# Patient Record
Sex: Female | Born: 1980 | Race: White | Hispanic: No | Marital: Married | Smoking: Never smoker
Health system: Southern US, Community
[De-identification: ages and names within clinical notes are randomized; demographics above are authoritative.]

## PROBLEM LIST (undated history)

## (undated) DIAGNOSIS — Z801 Family history of malignant neoplasm of trachea, bronchus and lung: Secondary | ICD-10-CM

## (undated) DIAGNOSIS — Z808 Family history of malignant neoplasm of other organs or systems: Secondary | ICD-10-CM

## (undated) DIAGNOSIS — Z800 Family history of malignant neoplasm of digestive organs: Secondary | ICD-10-CM

## (undated) DIAGNOSIS — Z803 Family history of malignant neoplasm of breast: Secondary | ICD-10-CM

## (undated) HISTORY — DX: Family history of malignant neoplasm of digestive organs: Z80.0

## (undated) HISTORY — DX: Family history of malignant neoplasm of other organs or systems: Z80.8

## (undated) HISTORY — DX: Family history of malignant neoplasm of trachea, bronchus and lung: Z80.1

## (undated) HISTORY — DX: Family history of malignant neoplasm of breast: Z80.3

## (undated) HISTORY — PX: TONSILLECTOMY: SUR1361

---
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
## 2000-06-22 ENCOUNTER — Encounter (INDEPENDENT_AMBULATORY_CARE_PROVIDER_SITE_OTHER): Payer: Self-pay | Admitting: Specialist

## 2000-06-22 ENCOUNTER — Other Ambulatory Visit: Admission: RE | Admit: 2000-06-22 | Discharge: 2000-06-22 | Payer: Self-pay | Admitting: Gynecology

## 2000-09-01 HISTORY — PX: BREAST ENHANCEMENT SURGERY: SHX7

## 2010-01-02 ENCOUNTER — Inpatient Hospital Stay (HOSPITAL_COMMUNITY): Admission: AD | Admit: 2010-01-02 | Discharge: 2010-01-05 | Payer: Self-pay | Admitting: Obstetrics and Gynecology

## 2010-01-03 ENCOUNTER — Encounter (INDEPENDENT_AMBULATORY_CARE_PROVIDER_SITE_OTHER): Payer: Self-pay | Admitting: Obstetrics and Gynecology

## 2010-11-19 LAB — CBC
HCT: 26.7 % — ABNORMAL LOW (ref 36.0–46.0)
Hemoglobin: 13.1 g/dL (ref 12.0–15.0)
Hemoglobin: 9.3 g/dL — ABNORMAL LOW (ref 12.0–15.0)
MCHC: 34.6 g/dL (ref 30.0–36.0)
MCV: 93.4 fL (ref 78.0–100.0)
Platelets: 162 10*3/uL (ref 150–400)
RBC: 2.86 MIL/uL — ABNORMAL LOW (ref 3.87–5.11)
RBC: 4.06 MIL/uL (ref 3.87–5.11)
WBC: 10.2 10*3/uL (ref 4.0–10.5)

## 2011-09-02 NOTE — L&D Delivery Note (Signed)
Delivery Note  SVD viable female Apgars 9,9 over intact perineum with 1st degree anterior lac.  Nuchal x 1 reduced. Placenta delivered spontaneously intact with 3VC. Repair with 3-0 Chromic with good support and hemostasis noted and R/V exam confirms.  PH art was sbet.  [place] cord blood was not done.  Mother and baby were doing well.  EBL 300  Candice Camp, MD

## 2011-12-12 ENCOUNTER — Inpatient Hospital Stay (HOSPITAL_COMMUNITY): Payer: Managed Care, Other (non HMO) | Admitting: Anesthesiology

## 2011-12-12 ENCOUNTER — Inpatient Hospital Stay (HOSPITAL_COMMUNITY)
Admission: AD | Admit: 2011-12-12 | Discharge: 2011-12-14 | DRG: 775 | Disposition: A | Discharge status: Home / Self Care | Payer: Managed Care, Other (non HMO) | Source: Ambulatory Visit | Attending: Obstetrics and Gynecology | Admitting: Obstetrics and Gynecology

## 2011-12-12 ENCOUNTER — Encounter (HOSPITAL_COMMUNITY): Payer: Self-pay | Admitting: *Deleted

## 2011-12-12 ENCOUNTER — Encounter (HOSPITAL_COMMUNITY): Payer: Self-pay | Admitting: Anesthesiology

## 2011-12-12 LAB — CBC
MCHC: 33.7 g/dL (ref 30.0–36.0)
RDW: 12.8 % (ref 11.5–15.5)
WBC: 9.8 10*3/uL (ref 4.0–10.5)

## 2011-12-12 LAB — GC/CHLAMYDIA PROBE AMP, GENITAL: Chlamydia: NEGATIVE

## 2011-12-12 LAB — STREP B DNA PROBE: GBS: NEGATIVE

## 2011-12-12 LAB — ABO/RH: RH Type: POSITIVE

## 2011-12-12 LAB — RUBELLA ANTIBODY, IGM: Rubella: IMMUNE

## 2011-12-12 MED ORDER — DIPHENHYDRAMINE HCL 50 MG/ML IJ SOLN: 12.5000 mg | INTRAMUSCULAR | Status: DC | PRN

## 2011-12-12 MED ORDER — SENNOSIDES-DOCUSATE SODIUM 8.6-50 MG PO TABS
2.0000 | ORAL_TABLET | Freq: Every day | ORAL | Status: DC
  Administered 2011-12-13: 2 via ORAL

## 2011-12-12 MED ORDER — MEASLES, MUMPS & RUBELLA VAC ~~LOC~~ INJ
0.5000 mL | INJECTION | Freq: Once | SUBCUTANEOUS | Status: DC
  Filled 2011-12-12: qty 0.5

## 2011-12-12 MED ORDER — FENTANYL 2.5 MCG/ML BUPIVACAINE 1/10 % EPIDURAL INFUSION (WH - ANES)
INTRAMUSCULAR | Status: DC | PRN
  Administered 2011-12-12: 14 mL/h via EPIDURAL

## 2011-12-12 MED ORDER — OXYCODONE-ACETAMINOPHEN 5-325 MG PO TABS: 1.0000 | ORAL_TABLET | ORAL | Status: DC | PRN

## 2011-12-12 MED ORDER — BENZOCAINE-MENTHOL 20-0.5 % EX AERO
1.0000 "application " | INHALATION_SPRAY | CUTANEOUS | Status: DC | PRN
  Administered 2011-12-14: 1 via TOPICAL

## 2011-12-12 MED ORDER — SIMETHICONE 80 MG PO CHEW: 80.0000 mg | CHEWABLE_TABLET | ORAL | Status: DC | PRN

## 2011-12-12 MED ORDER — PHENYLEPHRINE 40 MCG/ML (10ML) SYRINGE FOR IV PUSH (FOR BLOOD PRESSURE SUPPORT): 80.0000 ug | PREFILLED_SYRINGE | INTRAVENOUS | Status: DC | PRN

## 2011-12-12 MED ORDER — SODIUM CHLORIDE 0.9 % IJ SOLN: 3.0000 mL | INTRAMUSCULAR | Status: DC | PRN

## 2011-12-12 MED ORDER — LACTATED RINGERS IV SOLN: 500.0000 mL | INTRAVENOUS | Status: DC | PRN

## 2011-12-12 MED ORDER — EPHEDRINE 5 MG/ML INJ
10.0000 mg | INTRAVENOUS | Status: DC | PRN
  Filled 2011-12-12: qty 4

## 2011-12-12 MED ORDER — LIDOCAINE HCL (PF) 1 % IJ SOLN
INTRAMUSCULAR | Status: DC | PRN
  Administered 2011-12-12 (×3): 4 mL

## 2011-12-12 MED ORDER — CITRIC ACID-SODIUM CITRATE 334-500 MG/5ML PO SOLN: 30.0000 mL | ORAL | Status: DC | PRN

## 2011-12-12 MED ORDER — WITCH HAZEL-GLYCERIN EX PADS
1.0000 "application " | MEDICATED_PAD | CUTANEOUS | Status: DC | PRN
  Administered 2011-12-14: 1 via TOPICAL

## 2011-12-12 MED ORDER — OXYTOCIN 20 UNITS IN LACTATED RINGERS INFUSION - SIMPLE: 125.0000 mL/h | Freq: Once | INTRAVENOUS | Status: DC

## 2011-12-12 MED ORDER — LACTATED RINGERS IV SOLN: 500.0000 mL | Freq: Once | INTRAVENOUS | Status: DC

## 2011-12-12 MED ORDER — ONDANSETRON HCL 4 MG/2ML IJ SOLN: 4.0000 mg | INTRAMUSCULAR | Status: DC | PRN

## 2011-12-12 MED ORDER — DIPHENHYDRAMINE HCL 25 MG PO CAPS: 25.0000 mg | ORAL_CAPSULE | Freq: Four times a day (QID) | ORAL | Status: DC | PRN

## 2011-12-12 MED ORDER — IBUPROFEN 600 MG PO TABS
600.0000 mg | ORAL_TABLET | Freq: Four times a day (QID) | ORAL | Status: DC
  Administered 2011-12-13 – 2011-12-14 (×6): 600 mg via ORAL
  Filled 2011-12-12 (×6): qty 1

## 2011-12-12 MED ORDER — PRENATAL MULTIVITAMIN CH: 1.0000 | ORAL_TABLET | Freq: Every day | ORAL | Status: DC

## 2011-12-12 MED ORDER — TETANUS-DIPHTH-ACELL PERTUSSIS 5-2.5-18.5 LF-MCG/0.5 IM SUSP: 0.5000 mL | Freq: Once | INTRAMUSCULAR | Status: DC

## 2011-12-12 MED ORDER — ACETAMINOPHEN 325 MG PO TABS: 650.0000 mg | ORAL_TABLET | ORAL | Status: DC | PRN

## 2011-12-12 MED ORDER — PRENATAL MULTIVITAMIN CH
1.0000 | ORAL_TABLET | Freq: Every day | ORAL | Status: DC
  Administered 2011-12-13 – 2011-12-14 (×2): 1 via ORAL
  Filled 2011-12-12 (×2): qty 1

## 2011-12-12 MED ORDER — ONDANSETRON HCL 4 MG/2ML IJ SOLN: 4.0000 mg | Freq: Four times a day (QID) | INTRAMUSCULAR | Status: DC | PRN

## 2011-12-12 MED ORDER — FENTANYL 2.5 MCG/ML BUPIVACAINE 1/10 % EPIDURAL INFUSION (WH - ANES)
14.0000 mL/h | INTRAMUSCULAR | Status: DC
  Filled 2011-12-12: qty 60

## 2011-12-12 MED ORDER — MEDROXYPROGESTERONE ACETATE 150 MG/ML IM SUSP: 150.0000 mg | INTRAMUSCULAR | Status: DC | PRN

## 2011-12-12 MED ORDER — ZOLPIDEM TARTRATE 5 MG PO TABS: 5.0000 mg | ORAL_TABLET | Freq: Every evening | ORAL | Status: DC | PRN

## 2011-12-12 MED ORDER — PHENYLEPHRINE 40 MCG/ML (10ML) SYRINGE FOR IV PUSH (FOR BLOOD PRESSURE SUPPORT)
80.0000 ug | PREFILLED_SYRINGE | INTRAVENOUS | Status: DC | PRN
  Filled 2011-12-12: qty 5

## 2011-12-12 MED ORDER — DIBUCAINE 1 % RE OINT: 1.0000 "application " | TOPICAL_OINTMENT | RECTAL | Status: DC | PRN

## 2011-12-12 MED ORDER — ONDANSETRON HCL 4 MG PO TABS: 4.0000 mg | ORAL_TABLET | ORAL | Status: DC | PRN

## 2011-12-12 MED ORDER — LANOLIN HYDROUS EX OINT: TOPICAL_OINTMENT | CUTANEOUS | Status: DC | PRN

## 2011-12-12 MED ORDER — TERBUTALINE SULFATE 1 MG/ML IJ SOLN: 0.2500 mg | Freq: Once | INTRAMUSCULAR | Status: DC | PRN

## 2011-12-12 MED ORDER — LACTATED RINGERS IV SOLN: INTRAVENOUS | Status: DC

## 2011-12-12 MED ORDER — OXYTOCIN 20 UNITS IN LACTATED RINGERS INFUSION - SIMPLE
1.0000 m[IU]/min | INTRAVENOUS | Status: DC
  Administered 2011-12-12: 2 m[IU]/min via INTRAVENOUS
  Filled 2011-12-12: qty 1000

## 2011-12-12 MED ORDER — LIDOCAINE HCL (PF) 1 % IJ SOLN
30.0000 mL | INTRAMUSCULAR | Status: DC | PRN
  Filled 2011-12-12: qty 30

## 2011-12-12 MED ORDER — OXYCODONE-ACETAMINOPHEN 5-325 MG PO TABS
1.0000 | ORAL_TABLET | ORAL | Status: DC | PRN
  Administered 2011-12-13 (×2): 1 via ORAL
  Filled 2011-12-12 (×2): qty 1

## 2011-12-12 MED ORDER — FLEET ENEMA 7-19 GM/118ML RE ENEM: 1.0000 | ENEMA | RECTAL | Status: DC | PRN

## 2011-12-12 MED ORDER — IBUPROFEN 600 MG PO TABS: 600.0000 mg | ORAL_TABLET | Freq: Four times a day (QID) | ORAL | Status: DC | PRN

## 2011-12-12 MED ORDER — OXYTOCIN BOLUS FROM INFUSION
500.0000 mL | Freq: Once | INTRAVENOUS | Status: DC
  Filled 2011-12-12: qty 500

## 2011-12-12 MED ORDER — EPHEDRINE 5 MG/ML INJ: 10.0000 mg | INTRAVENOUS | Status: DC | PRN

## 2011-12-12 NOTE — Anesthesia Preprocedure Evaluation (Signed)
Anesthesia Evaluation  Patient identified by MRN, date of birth, ID band Patient awake    Reviewed: Allergy & Precautions, H&P , NPO status , Patient's Chart, lab work & pertinent test results, reviewed documented beta blocker date and time   History of Anesthesia Complications Negative for: history of anesthetic complications  Airway Mallampati: I TM Distance: >3 FB Neck ROM: full    Dental  (+) Teeth Intact   Pulmonary neg pulmonary ROS,  breath sounds clear to auscultation        Cardiovascular negative cardio ROS  Rhythm:regular Rate:Normal     Neuro/Psych negative neurological ROS  negative psych ROS   GI/Hepatic negative GI ROS, Neg liver ROS,   Endo/Other  negative endocrine ROS  Renal/GU negative Renal ROS  negative genitourinary   Musculoskeletal   Abdominal   Peds  Hematology negative hematology ROS (+)   Anesthesia Other Findings   Reproductive/Obstetrics (+) Pregnancy                           Anesthesia Physical Anesthesia Plan  ASA: II  Anesthesia Plan: Epidural   Post-op Pain Management:    Induction:   Airway Management Planned:   Additional Equipment:   Intra-op Plan:   Post-operative Plan:   Informed Consent: I have reviewed the patients History and Physical, chart, labs and discussed the procedure including the risks, benefits and alternatives for the proposed anesthesia with the patient or authorized representative who has indicated his/her understanding and acceptance.     Plan Discussed with:   Anesthesia Plan Comments:         Anesthesia Quick Evaluation  

## 2011-12-12 NOTE — H&P (Signed)
Maureen Compton is a 31 y.o. female presenting for ROM at 100 but had question of leaking since yesterday and was seen in our office and had normal eval.  Continued to have intermittant leaking since and had a gush of clear fluid this afternoon.  Pregnancy otherwise uncomplicated.  GBS -. History OB History    Grav Para Term Preterm Abortions TAB SAB Ect Mult Living   2 1 1  0 0 0 0 0 0 1     History reviewed. No pertinent past medical history. Past Surgical History  Procedure Date  . Breast enhancement surgery 2002  . Tonsillectomy    Family History: family history is not on file. Social History:  reports that she has never smoked. She has never used smokeless tobacco. She reports that she does not drink alcohol or use illicit drugs.  ROS  Cx 4/50/-2 gross rupturr Blood pressure 109/60, pulse 64, temperature 98.2 F (36.8 C), temperature source Oral, resp. rate 18, height 5\' 7"  (1.702 m), weight 91.173 kg (201 lb), SpO2 99.00%. Exam Physical Exam  Prenatal labs: ABO, Rh: A/Positive/-- (04/12 1503) Antibody: Negative (04/12 1503) Rubella: Immune (04/12 1503) RPR: Nonreactive (04/12 1503)  HBsAg: Negative (04/12 1503)  HIV: Non-reactive (04/12 1503)  GBS: Negative (04/12 1503)   Assessment/Plan: IUP at term with SROM  Plan pitocin augmentation Anticipate SVD   Tyrell Seifer C 12/12/2011, 8:49 PM

## 2011-12-12 NOTE — Anesthesia Procedure Notes (Signed)
Epidural Patient location during procedure: OB Start time: 12/12/2011 6:12 PM Reason for block: procedure for pain  Staffing Performed by: anesthesiologist   Preanesthetic Checklist Completed: patient identified, site marked, surgical consent, pre-op evaluation, timeout performed, IV checked, risks and benefits discussed and monitors and equipment checked  Epidural Patient position: sitting Prep: site prepped and draped and DuraPrep Patient monitoring: continuous pulse ox and blood pressure Approach: midline Injection technique: LOR air  Needle:  Needle type: Tuohy  Needle gauge: 17 G Needle length: 9 cm Needle insertion depth: 4 cm Catheter type: closed end flexible Catheter size: 19 Gauge Catheter at skin depth: 9 cm Test dose: negative  Assessment Events: blood not aspirated, injection not painful, no injection resistance, negative IV test and no paresthesia  Additional Notes Discussed risk of headache, infection, bleeding, nerve injury and failed or incomplete block.  Patient voices understanding and wishes to proceed.

## 2011-12-12 NOTE — MAU Note (Signed)
C/o leaking of fluid that started 12/11/11 @ 0900; seen in office @ 1100 and told not ruptured; leaking has continued since then until 1345 today when she felt and saw more thin clear fluid Maureen Compton [facility] 12/02/11

## 2011-12-13 LAB — CBC
MCH: 29.5 pg (ref 26.0–34.0)
MCHC: 32.6 g/dL (ref 30.0–36.0)
MCV: 90.7 fL (ref 78.0–100.0)
Platelets: 205 10*3/uL (ref 150–400)
RBC: 3.96 MIL/uL (ref 3.87–5.11)
RDW: 12.9 % (ref 11.5–15.5)

## 2011-12-13 LAB — RPR: RPR Ser Ql: NONREACTIVE

## 2011-12-13 NOTE — Progress Notes (Signed)
Post Partum Day 1 Subjective: no complaints and tolerating PO  Objective: Blood pressure 114/71, pulse 74, temperature 97.8 F (36.6 C), temperature source Axillary, resp. rate 16, height 5\' 7"  (1.702 m), weight 91.173 kg (201 lb), SpO2 99.00%, unknown if currently breastfeeding.  Physical Exam:  General: alert, cooperative and no distress Lochia: appropriate Uterine Fundus: firm Incision: healing well DVT Evaluation: No evidence of DVT seen on physical exam.   Basename 12/13/11 0546 12/12/11 1702  HGB 11.7* 13.2  HCT 35.9* 39.2    Assessment/Plan: Plan for discharge tomorrow and Breastfeeding   LOS: 1 day   Wyoma Genson C 12/13/2011, 9:50 AM

## 2011-12-13 NOTE — Anesthesia Postprocedure Evaluation (Signed)
  Anesthesia Post-op Note  Patient: Maureen Compton  Procedure(s) Performed: * No procedures listed *  Patient Location: PACU and Mother/Baby  Anesthesia Type: Epidural  Level of Consciousness: awake, alert  and oriented  Airway and Oxygen Therapy: Patient Spontanous Breathing  Post-op Assessment: Patient's Cardiovascular Status Stable and Respiratory Function Stable  Post-op Vital Signs: stable  Complications: No apparent anesthesia complications

## 2011-12-14 MED ORDER — OXYCODONE-ACETAMINOPHEN 5-325 MG PO TABS: 1.0000 | ORAL_TABLET | ORAL | Status: AC | PRN

## 2011-12-14 MED ORDER — BENZOCAINE-MENTHOL 20-0.5 % EX AERO
INHALATION_SPRAY | CUTANEOUS | Status: AC
  Administered 2011-12-14: 1 via TOPICAL
  Filled 2011-12-14: qty 56

## 2011-12-14 NOTE — Progress Notes (Signed)
Post Partum Day 2 Subjective: no complaints and up ad lib  Objective: Blood pressure 97/65, pulse 53, temperature 97.6 F (36.4 C), temperature source Oral, resp. rate 18, height 5\' 7"  (1.702 m), weight 91.173 kg (201 lb), SpO2 99.00%, unknown if currently breastfeeding.  Physical Exam:  General: alert, cooperative and no distress Lochia: appropriate Uterine Fundus: firm Incision:  DVT Evaluation: No evidence of DVT seen on physical exam.   Basename 12/13/11 0546 12/12/11 1702  HGB 11.7* 13.2  HCT 35.9* 39.2    Assessment/Plan: Discharge home and Breastfeeding   LOS: 2 days   Steel Kerney C 12/14/2011, 9:44 AM

## 2011-12-29 NOTE — Discharge Summary (Signed)
Obstetric Discharge Summary Reason for Admission: rupture of membranes Prenatal Procedures: ultrasound Intrapartum Procedures: spontaneous vaginal delivery Postpartum Procedures: none Complications-Operative and Postpartum: 1 degree perineal laceration Hemoglobin  Date Value Range Status  12/13/2011 11.7* 12.0-15.0 (g/dL) Final     HCT  Date Value Range Status  12/13/2011 35.9* 36.0-46.0 (%) Final    Physical Exam:  General: alert and cooperative Lochia: appropriate Uterine Fundus: firm Incision: perineum intact DVT Evaluation: No evidence of DVT seen on physical exam.  Discharge Diagnoses: Term Pregnancy-delivered  Discharge Information: Date: 12/29/2011 Activity: pelvic rest Diet: routine Medications: PNV and Ibuprofen Condition: stable Instructions: refer to practice specific booklet Discharge to: home   Newborn Data: Live born female  Birth Weight: 6 lb 13.9 oz (3115 g) APGAR: 9, 9  Home with mother.  Maureen Compton G 12/29/2011, 8:44 AM

## 2014-07-03 ENCOUNTER — Encounter (HOSPITAL_COMMUNITY): Payer: Self-pay | Admitting: *Deleted

## 2014-07-13 ENCOUNTER — Other Ambulatory Visit: Payer: Self-pay | Admitting: Obstetrics and Gynecology

## 2014-07-17 LAB — CYTOLOGY - PAP

## 2016-03-13 DIAGNOSIS — E559 Vitamin D deficiency, unspecified: Secondary | ICD-10-CM | POA: Diagnosis not present

## 2016-03-13 DIAGNOSIS — Z0000 Encounter for general adult medical examination without abnormal findings: Secondary | ICD-10-CM | POA: Diagnosis not present

## 2016-03-19 DIAGNOSIS — K219 Gastro-esophageal reflux disease without esophagitis: Secondary | ICD-10-CM | POA: Diagnosis not present

## 2016-03-19 DIAGNOSIS — Z1389 Encounter for screening for other disorder: Secondary | ICD-10-CM | POA: Diagnosis not present

## 2016-03-19 DIAGNOSIS — J309 Allergic rhinitis, unspecified: Secondary | ICD-10-CM | POA: Diagnosis not present

## 2016-03-19 DIAGNOSIS — E559 Vitamin D deficiency, unspecified: Secondary | ICD-10-CM | POA: Diagnosis not present

## 2016-03-19 DIAGNOSIS — Z0000 Encounter for general adult medical examination without abnormal findings: Secondary | ICD-10-CM | POA: Diagnosis not present

## 2016-06-10 DIAGNOSIS — J028 Acute pharyngitis due to other specified organisms: Secondary | ICD-10-CM | POA: Diagnosis not present

## 2016-06-10 DIAGNOSIS — Z6827 Body mass index (BMI) 27.0-27.9, adult: Secondary | ICD-10-CM | POA: Diagnosis not present

## 2016-08-21 DIAGNOSIS — Z01419 Encounter for gynecological examination (general) (routine) without abnormal findings: Secondary | ICD-10-CM | POA: Diagnosis not present

## 2016-08-21 DIAGNOSIS — L680 Hirsutism: Secondary | ICD-10-CM | POA: Diagnosis not present

## 2016-08-21 DIAGNOSIS — E282 Polycystic ovarian syndrome: Secondary | ICD-10-CM | POA: Diagnosis not present

## 2016-08-21 DIAGNOSIS — Z6827 Body mass index (BMI) 27.0-27.9, adult: Secondary | ICD-10-CM | POA: Diagnosis not present

## 2016-08-21 DIAGNOSIS — Z01411 Encounter for gynecological examination (general) (routine) with abnormal findings: Secondary | ICD-10-CM | POA: Diagnosis not present

## 2017-04-09 DIAGNOSIS — E559 Vitamin D deficiency, unspecified: Secondary | ICD-10-CM | POA: Diagnosis not present

## 2017-04-09 DIAGNOSIS — R5383 Other fatigue: Secondary | ICD-10-CM | POA: Diagnosis not present

## 2017-04-09 DIAGNOSIS — Z0000 Encounter for general adult medical examination without abnormal findings: Secondary | ICD-10-CM | POA: Diagnosis not present

## 2017-04-16 DIAGNOSIS — Z6827 Body mass index (BMI) 27.0-27.9, adult: Secondary | ICD-10-CM | POA: Diagnosis not present

## 2017-04-16 DIAGNOSIS — Z0000 Encounter for general adult medical examination without abnormal findings: Secondary | ICD-10-CM | POA: Diagnosis not present

## 2017-04-16 DIAGNOSIS — J3089 Other allergic rhinitis: Secondary | ICD-10-CM | POA: Diagnosis not present

## 2017-04-16 DIAGNOSIS — L680 Hirsutism: Secondary | ICD-10-CM | POA: Diagnosis not present

## 2017-04-16 DIAGNOSIS — Z1389 Encounter for screening for other disorder: Secondary | ICD-10-CM | POA: Diagnosis not present

## 2017-04-17 DIAGNOSIS — Z1212 Encounter for screening for malignant neoplasm of rectum: Secondary | ICD-10-CM | POA: Diagnosis not present

## 2017-06-22 DIAGNOSIS — Z6826 Body mass index (BMI) 26.0-26.9, adult: Secondary | ICD-10-CM | POA: Diagnosis not present

## 2017-06-22 DIAGNOSIS — R52 Pain, unspecified: Secondary | ICD-10-CM | POA: Diagnosis not present

## 2017-08-18 DIAGNOSIS — J069 Acute upper respiratory infection, unspecified: Secondary | ICD-10-CM | POA: Diagnosis not present

## 2017-08-18 DIAGNOSIS — J029 Acute pharyngitis, unspecified: Secondary | ICD-10-CM | POA: Diagnosis not present

## 2017-08-18 DIAGNOSIS — J111 Influenza due to unidentified influenza virus with other respiratory manifestations: Secondary | ICD-10-CM | POA: Diagnosis not present

## 2017-08-18 DIAGNOSIS — R509 Fever, unspecified: Secondary | ICD-10-CM | POA: Diagnosis not present

## 2017-11-24 DIAGNOSIS — J029 Acute pharyngitis, unspecified: Secondary | ICD-10-CM | POA: Diagnosis not present

## 2017-11-24 DIAGNOSIS — Z6827 Body mass index (BMI) 27.0-27.9, adult: Secondary | ICD-10-CM | POA: Diagnosis not present

## 2017-11-24 DIAGNOSIS — J020 Streptococcal pharyngitis: Secondary | ICD-10-CM | POA: Diagnosis not present

## 2017-11-24 DIAGNOSIS — J309 Allergic rhinitis, unspecified: Secondary | ICD-10-CM | POA: Diagnosis not present

## 2018-02-10 DIAGNOSIS — L309 Dermatitis, unspecified: Secondary | ICD-10-CM | POA: Diagnosis not present

## 2018-02-10 DIAGNOSIS — Z6827 Body mass index (BMI) 27.0-27.9, adult: Secondary | ICD-10-CM | POA: Diagnosis not present

## 2018-02-16 DIAGNOSIS — Z01419 Encounter for gynecological examination (general) (routine) without abnormal findings: Secondary | ICD-10-CM | POA: Diagnosis not present

## 2018-02-16 DIAGNOSIS — Z6827 Body mass index (BMI) 27.0-27.9, adult: Secondary | ICD-10-CM | POA: Diagnosis not present

## 2018-04-13 DIAGNOSIS — R5383 Other fatigue: Secondary | ICD-10-CM | POA: Diagnosis not present

## 2018-04-13 DIAGNOSIS — Z0000 Encounter for general adult medical examination without abnormal findings: Secondary | ICD-10-CM | POA: Diagnosis not present

## 2018-04-13 DIAGNOSIS — E559 Vitamin D deficiency, unspecified: Secondary | ICD-10-CM | POA: Diagnosis not present

## 2018-04-15 DIAGNOSIS — R600 Localized edema: Secondary | ICD-10-CM | POA: Diagnosis not present

## 2018-04-15 DIAGNOSIS — K219 Gastro-esophageal reflux disease without esophagitis: Secondary | ICD-10-CM | POA: Diagnosis not present

## 2018-04-15 DIAGNOSIS — E559 Vitamin D deficiency, unspecified: Secondary | ICD-10-CM | POA: Diagnosis not present

## 2018-04-15 DIAGNOSIS — Z0000 Encounter for general adult medical examination without abnormal findings: Secondary | ICD-10-CM | POA: Diagnosis not present

## 2018-04-15 DIAGNOSIS — J3089 Other allergic rhinitis: Secondary | ICD-10-CM | POA: Diagnosis not present

## 2018-04-15 DIAGNOSIS — Z1331 Encounter for screening for depression: Secondary | ICD-10-CM | POA: Diagnosis not present

## 2018-05-17 DIAGNOSIS — F4320 Adjustment disorder, unspecified: Secondary | ICD-10-CM | POA: Diagnosis not present

## 2018-06-01 DIAGNOSIS — F4320 Adjustment disorder, unspecified: Secondary | ICD-10-CM | POA: Diagnosis not present

## 2018-06-18 DIAGNOSIS — F4320 Adjustment disorder, unspecified: Secondary | ICD-10-CM | POA: Diagnosis not present

## 2018-06-25 DIAGNOSIS — F4320 Adjustment disorder, unspecified: Secondary | ICD-10-CM | POA: Diagnosis not present

## 2018-07-07 DIAGNOSIS — F4320 Adjustment disorder, unspecified: Secondary | ICD-10-CM | POA: Diagnosis not present

## 2018-07-22 DIAGNOSIS — F4320 Adjustment disorder, unspecified: Secondary | ICD-10-CM | POA: Diagnosis not present

## 2018-08-12 DIAGNOSIS — F4320 Adjustment disorder, unspecified: Secondary | ICD-10-CM | POA: Diagnosis not present

## 2018-09-08 DIAGNOSIS — F4320 Adjustment disorder, unspecified: Secondary | ICD-10-CM | POA: Diagnosis not present

## 2018-09-22 DIAGNOSIS — F4320 Adjustment disorder, unspecified: Secondary | ICD-10-CM | POA: Diagnosis not present

## 2018-10-14 DIAGNOSIS — F4320 Adjustment disorder, unspecified: Secondary | ICD-10-CM | POA: Diagnosis not present

## 2019-04-27 DIAGNOSIS — D2261 Melanocytic nevi of right upper limb, including shoulder: Secondary | ICD-10-CM | POA: Diagnosis not present

## 2019-04-27 DIAGNOSIS — L814 Other melanin hyperpigmentation: Secondary | ICD-10-CM | POA: Diagnosis not present

## 2019-04-27 DIAGNOSIS — L720 Epidermal cyst: Secondary | ICD-10-CM | POA: Diagnosis not present

## 2019-04-27 DIAGNOSIS — D225 Melanocytic nevi of trunk: Secondary | ICD-10-CM | POA: Diagnosis not present

## 2019-04-29 DIAGNOSIS — Z0000 Encounter for general adult medical examination without abnormal findings: Secondary | ICD-10-CM | POA: Diagnosis not present

## 2019-04-29 DIAGNOSIS — Z23 Encounter for immunization: Secondary | ICD-10-CM | POA: Diagnosis not present

## 2019-05-02 DIAGNOSIS — R82998 Other abnormal findings in urine: Secondary | ICD-10-CM | POA: Diagnosis not present

## 2019-05-02 DIAGNOSIS — G4700 Insomnia, unspecified: Secondary | ICD-10-CM | POA: Diagnosis not present

## 2019-05-02 DIAGNOSIS — Z0000 Encounter for general adult medical examination without abnormal findings: Secondary | ICD-10-CM | POA: Diagnosis not present

## 2019-05-02 DIAGNOSIS — Z1331 Encounter for screening for depression: Secondary | ICD-10-CM | POA: Diagnosis not present

## 2019-06-22 DIAGNOSIS — Z01419 Encounter for gynecological examination (general) (routine) without abnormal findings: Secondary | ICD-10-CM | POA: Diagnosis not present

## 2019-06-22 DIAGNOSIS — Z6821 Body mass index (BMI) 21.0-21.9, adult: Secondary | ICD-10-CM | POA: Diagnosis not present

## 2019-09-26 DIAGNOSIS — Z20828 Contact with and (suspected) exposure to other viral communicable diseases: Secondary | ICD-10-CM | POA: Diagnosis not present

## 2019-11-06 DIAGNOSIS — Z20822 Contact with and (suspected) exposure to covid-19: Secondary | ICD-10-CM | POA: Diagnosis not present

## 2020-04-05 DIAGNOSIS — Z20822 Contact with and (suspected) exposure to covid-19: Secondary | ICD-10-CM | POA: Diagnosis not present

## 2020-04-10 DIAGNOSIS — F4320 Adjustment disorder, unspecified: Secondary | ICD-10-CM | POA: Diagnosis not present

## 2020-06-15 DIAGNOSIS — Z20822 Contact with and (suspected) exposure to covid-19: Secondary | ICD-10-CM | POA: Diagnosis not present

## 2020-06-25 DIAGNOSIS — Z1152 Encounter for screening for COVID-19: Secondary | ICD-10-CM | POA: Diagnosis not present

## 2020-06-25 DIAGNOSIS — R509 Fever, unspecified: Secondary | ICD-10-CM | POA: Diagnosis not present

## 2020-06-25 DIAGNOSIS — B349 Viral infection, unspecified: Secondary | ICD-10-CM | POA: Diagnosis not present

## 2020-06-25 DIAGNOSIS — R197 Diarrhea, unspecified: Secondary | ICD-10-CM | POA: Diagnosis not present

## 2020-06-25 DIAGNOSIS — Z20818 Contact with and (suspected) exposure to other bacterial communicable diseases: Secondary | ICD-10-CM | POA: Diagnosis not present

## 2020-06-27 DIAGNOSIS — Z1159 Encounter for screening for other viral diseases: Secondary | ICD-10-CM | POA: Diagnosis not present

## 2020-07-20 DIAGNOSIS — Z1159 Encounter for screening for other viral diseases: Secondary | ICD-10-CM | POA: Diagnosis not present

## 2020-09-25 DIAGNOSIS — R7989 Other specified abnormal findings of blood chemistry: Secondary | ICD-10-CM | POA: Diagnosis not present

## 2020-09-25 DIAGNOSIS — Z0000 Encounter for general adult medical examination without abnormal findings: Secondary | ICD-10-CM | POA: Diagnosis not present

## 2020-09-25 DIAGNOSIS — E559 Vitamin D deficiency, unspecified: Secondary | ICD-10-CM | POA: Diagnosis not present

## 2020-09-28 DIAGNOSIS — Z1339 Encounter for screening examination for other mental health and behavioral disorders: Secondary | ICD-10-CM | POA: Diagnosis not present

## 2020-09-28 DIAGNOSIS — Z1331 Encounter for screening for depression: Secondary | ICD-10-CM | POA: Diagnosis not present

## 2020-09-28 DIAGNOSIS — Z0000 Encounter for general adult medical examination without abnormal findings: Secondary | ICD-10-CM | POA: Diagnosis not present

## 2020-12-04 DIAGNOSIS — L814 Other melanin hyperpigmentation: Secondary | ICD-10-CM | POA: Diagnosis not present

## 2021-01-31 ENCOUNTER — Telehealth: Payer: Self-pay | Admitting: Genetic Counselor

## 2021-01-31 NOTE — Telephone Encounter (Signed)
Received a genetic counseling referral from Dr. Felipa Eth for Family history of malignant neoplasm of digestive organs. Maureen Compton returned my call and has been scheduled to see [name] on 6/16 at 10am. Pt aware to arrive 15 minutes early.

## 2021-02-14 ENCOUNTER — Encounter: Payer: Self-pay | Admitting: Genetic Counselor

## 2021-02-14 ENCOUNTER — Other Ambulatory Visit: Payer: Self-pay

## 2021-02-14 ENCOUNTER — Inpatient Hospital Stay: Payer: BC Managed Care – PPO | Attending: Obstetrics and Gynecology | Admitting: Genetic Counselor

## 2021-02-14 ENCOUNTER — Inpatient Hospital Stay: Payer: BC Managed Care – PPO

## 2021-02-14 DIAGNOSIS — Z808 Family history of malignant neoplasm of other organs or systems: Secondary | ICD-10-CM | POA: Insufficient documentation

## 2021-02-14 DIAGNOSIS — Z800 Family history of malignant neoplasm of digestive organs: Secondary | ICD-10-CM | POA: Diagnosis not present

## 2021-02-14 DIAGNOSIS — Z803 Family history of malignant neoplasm of breast: Secondary | ICD-10-CM

## 2021-02-14 DIAGNOSIS — Z8481 Family history of carrier of genetic disease: Secondary | ICD-10-CM | POA: Diagnosis not present

## 2021-02-14 DIAGNOSIS — Z801 Family history of malignant neoplasm of trachea, bronchus and lung: Secondary | ICD-10-CM

## 2021-02-14 HISTORY — DX: Family history of carrier of genetic disease: Z84.81

## 2021-02-14 LAB — GENETIC SCREENING ORDER

## 2021-02-14 NOTE — Progress Notes (Signed)
REFERRING PROVIDER: Prince Solian, MD [place],  [place] [address]  PRIMARY PROVIDER:  Pcp, No  PRIMARY REASON FOR VISIT:  1. Family history of gene mutation   2. Family history of nonmelanoma skin cancer   3. Family history of stomach cancer   4. Family history of thyroid cancer   5. Family history of lung cancer   6. Family history of breast cancer      HISTORY OF PRESENT ILLNESS:   Maureen Compton, a 40 y.o. female, was seen for a [place] cancer genetics consultation at the request of Dr. Dagmar Hait due to a family history of a known cancer gene mutation and cancer.  Maureen Compton presents to clinic today to discuss the possibility of a hereditary predisposition to cancer, genetic testing, and to further clarify her future cancer risks, as well as potential cancer risks for family members.   Maureen Compton does not have a personal history of cancer.    RISK FACTORS:  Menarche was at age 39.  First live birth at age 89.  OCP use for approximately  14  years.  Ovaries intact: yes.  Hysterectomy: no.  Menopausal status: premenopausal.  HRT use: 0 years. Colonoscopy: no; not examined. Mammogram within the last year: no, will plan for one this year. Number of breast biopsies: 0. Any excessive radiation exposure in the past: no.  Past Medical History:  Diagnosis Date   Family history of breast cancer    Family history of gene mutation 02/14/2021   Family history of lung cancer    Family history of nonmelanoma skin cancer    Family history of stomach cancer    Family history of thyroid cancer     Past Surgical History:  Procedure Laterality Date   BREAST ENHANCEMENT SURGERY  2002   TONSILLECTOMY      Social History   Socioeconomic History   Marital status: Married    Spouse name: Not on file   Number of children: Not on file   Years of education: Not on file   Highest education level: Not on file  Occupational History   Not on file  Tobacco Use   Smoking  status: Never   Smokeless tobacco: Never  Substance and Sexual Activity   Alcohol use: No   Drug use: No   Sexual activity: Yes  Other Topics Concern   Not on file  Social History Narrative   Not on file   Social Determinants of Health   Financial Resource Strain: Not on file  Food Insecurity: Not on file  Transportation Needs: Not on file  Physical Activity: Not on file  Stress: Not on file  Social Connections: Not on file     FAMILY HISTORY:  We obtained a detailed, 4-generation family history.  Significant diagnoses are listed below: Family History  Problem Relation Age of Onset   Skin cancer Mother        dx 33s and older, non-melanoma   Other Mother        CHEK2 gene mutation c.349A>G (p.Arg117Gly)   Diabetes Father    Stomach cancer Maternal Uncle 60   Lung cancer Paternal Grandmother        dx 39s, smoker   Thyroid cancer Cousin 9       CHEK2 gene mutation   Ovarian cancer Other        maternal great-aunt   Cervical cancer Other        maternal great-aunt   Breast cancer Other  paternal great-aunts   Maureen Compton has two sons (ages 47 and 101). She has one brother (age 36). None of these relatives have had cancer.  Maureen Compton mother is alive at age 33 and has had non-melanoma skin cancer beginning in her 16s, as well as positive genetic testing for a CHEK2 gene mutation called c.349A>G (p.Arg117Gly). There was one maternal aunt and four maternal uncles (three of whom were maternal half-brothers to Maureen Compton's mother). One uncle died from stomach cancer around age 32. One maternal first cousin was diagnosed with thyroid cancer at age 36 and tested positive for the CHEK2 mutation. Maureen Compton maternal grandmother died around age 2 without cancer. Her maternal grandfather died at age 71 without cancer. One maternal great-aunt had ovarian cancer, and another great-aunt had cervical cancer.  Maureen Compton father is alive at age 33 without cancer. There is  one paternal aunt and two paternal uncles. There is no known cancer among paternal aunts/uncles or paternal cousins. Ms. Heiner paternal grandmother died in her 98s with lung cancer and was a smoker. Her paternal grandfather died in his 85s without cancer. Her grandmother was one of three children - most of whom had cancer at older ages. At least three of these great-aunts had breast cancer.  Maureen Compton is aware of previous family history of genetic testing for hereditary cancer risks. Patient's maternal ancestors are of [place] descent, and paternal ancestors are of [place] descent. There is no reported Ashkenazi Jewish ancestry. There is no known consanguinity.  GENETIC COUNSELING ASSESSMENT: Maureen Compton is a 40 y.o. female with a family history of a known CHEK2 gene mutation. We, therefore, discussed and recommended the following at today's visit.   DISCUSSION:  We discussed that Maureen Compton has a 50% (1 in 2) chance to also have the CHEK2 variant that was discovered in her mother. We reviewed the cancer risks that are associated with CHEK2 mutations, including an increased risk of breast, colon, and prostate cancers. These estimated cancer risks vary widely and may be influenced by family history. Individuals with CHEK2 mutations may opt for increased cancer screening for associated cancer risks per the NCCN guidelines.  We discussed that testing is beneficial for several reasons, including knowing about other cancer risks, identifying potential screening and risk-reduction options that may be appropriate, and to understand if other family members could be at risk for cancer and allow them to undergo genetic testing. We reviewed the characteristics, features and inheritance patterns of hereditary cancer syndromes. We also discussed genetic testing, including the appropriate family members to test, the process of testing, insurance coverage, genetic discrimination, and turn-around-time  for results. We discussed the implications of a negative, positive and/or variant of uncertain significant result.   We recommended Maureen Compton pursue genetic testing for the familial CHEK2 pathogenic variant (c.349A>G) with reflex testing to the [facility] Multi-Cancer panel. The Multi-Cancer Panel offered by [facility] includes sequencing and/or deletion duplication testing of the following 84 genes: AIP, ALK, APC, ATM, AXIN2,BAP1,  BARD1, BLM, BMPR1A, BRCA1, BRCA2, BRIP1, CASR, CDC73, CDH1, CDK4, CDKN1B, CDKN1C, CDKN2A (p14ARF), CDKN2A (p16INK4a), CEBPA, CHEK2, CTNNA1, DICER1, DIS3L2, EGFR (c.2369C>T, p.Thr790Met variant only), EPCAM (Deletion/duplication testing only), FH, FLCN, GATA2, GPC3, GREM1 (Promoter region deletion/duplication testing only), HOXB13 (c.251G>A, p.Gly84Glu), HRAS, KIT, MAX, MEN1, MET, MITF (c.952G>A, p.Glu318Lys variant only), MLH1, MSH2, MSH3, MSH6, MUTYH, NBN, NF1, NF2, NTHL1, PALB2, PDGFRA, PHOX2B, PMS2, POLD1, POLE, POT1, PRKAR1A, PTCH1, PTEN, RAD50, RAD51C, RAD51D, RB1, RECQL4, RET, RUNX1, SDHAF2, SDHA (sequence changes  only), SDHB, SDHC, SDHD, SMAD4, SMARCA4, SMARCB1, SMARCE1, STK11, SUFU, TERC, TERT, TMEM127, TP53, TSC1, TSC2, VHL, WRN and WT1.    Based on Maureen Compton's family history of a known CHEK2 gene mutation, she meets medical criteria for genetic testing. Despite that she meets criteria, there may still be an out of pocket cost. We discussed that if her out of pocket cost for testing is over $100, the laboratory will reach out to let her know. If the out of pocket cost of testing is less than $100 she will be billed by the genetic testing laboratory.   Lastly, we discussed that some people do not want to undergo genetic testing due to fear of genetic discrimination. A federal law called the Genetic Information Non-Discrimination Act (GINA) of 2008 helps protect individuals against genetic discrimination based on their genetic test results. It impacts both health insurance and  employment. With health insurance, it protects against increased premiums, being kicked off insurance or being forced to take a test in order to be insured. For employment it protects against hiring, firing and promoting decisions based on genetic test results. Health status due to a cancer diagnosis is not protected under GINA. Additionally, life, disability, and long-term care insurance is not protected under GINA.    PLAN: After considering the risks, benefits, and limitations, Maureen Compton provided informed consent to pursue genetic testing and the blood sample was sent to [facility] for analysis of the CHEK2 gene + Multi-Cancer panel. Results should be available within approximately two-three weeks' time, at which point they will be disclosed by telephone to Maureen Compton, as will any additional recommendations warranted by these results. Maureen Compton will receive a summary of her genetic counseling visit and a copy of her results once available. This information will also be available in Epic.   Maureen Compton questions were answered to her satisfaction today. Our contact information was provided should additional questions or concerns arise. Thank you for the referral and allowing [place] to share in the care of your patient.   Clint Guy, [place], [facility] Licensed, Certified Dispensing [email or web address] Phone: [phone]  The patient was seen for a total of 40 minutes in face-to-face genetic counseling. Patient was seen alone. This patient was discussed with Drs. Magrinat, Lindi Adie and/or Burr Medico who agrees with the above.    _______________________________________________________________________ For Office Staff:  Number of people involved in session: 1 Was an Intern/ student involved with case: no

## 2021-02-22 ENCOUNTER — Ambulatory Visit: Payer: Self-pay | Admitting: Genetic Counselor

## 2021-02-22 ENCOUNTER — Telehealth: Payer: Self-pay | Admitting: Genetic Counselor

## 2021-02-22 DIAGNOSIS — Z1379 Encounter for other screening for genetic and chromosomal anomalies: Secondary | ICD-10-CM | POA: Insufficient documentation

## 2021-02-22 NOTE — Telephone Encounter (Signed)
Revealed negative genetic testing. Maureen Compton did not inherit the CHEK2 mutation that was previously detected on her mother's testing. Therefore, we expect that her risks to develop CHEK2-related cancers are the same as they are in the general population.

## 2021-02-22 NOTE — Progress Notes (Signed)
HPI:  [name] was previously seen in the [facility] due to a family history of a known CHEK2 mutation and concerns regarding a hereditary predisposition to cancer. Please refer to our prior cancer genetics clinic note for more information regarding our discussion, assessment and recommendations, at the time. [name] recent genetic test results were disclosed to her, as were recommendations warranted by these results. These results and recommendations are discussed in more detail below.  FAMILY HISTORY:  We obtained a detailed, 4-generation family history.  Significant diagnoses are listed below: Family History  Problem Relation Age of Onset   Skin cancer Mother        dx 19s and older, non-melanoma   Other Mother        CHEK2 gene mutation c.349A>G (p.Arg117Gly)   Diabetes Father    Stomach cancer Maternal Uncle 22   Lung cancer Paternal Grandmother        dx 41s, smoker   Thyroid cancer Cousin 61       CHEK2 gene mutation   Ovarian cancer Other        maternal great-aunt   Cervical cancer Other        maternal great-aunt   Breast cancer Other        paternal great-aunts   [name] has two sons (ages 23 and 55). She has one brother (age 51). None of these relatives have had cancer.   [name] mother is alive at age 46 and has had non-melanoma skin cancer beginning in her 23s, as well as positive genetic testing for a CHEK2 gene mutation called c.349A>G (p.Arg117Gly). There was one maternal aunt and four maternal uncles (three of whom were maternal half-brothers to [name] mother). One uncle died from stomach cancer around age 22. One maternal first cousin was diagnosed with thyroid cancer at age 39 and tested positive for the CHEK2 mutation. [name] maternal grandmother died around age 75 without cancer. Her maternal grandfather died at age 93 without cancer. One maternal great-aunt had ovarian cancer, and another great-aunt had cervical  cancer.   [name] father is alive at age 42 without cancer. There is one paternal aunt and two paternal uncles. There is no known cancer among paternal aunts/uncles or paternal cousins. [name] paternal grandmother died in her 49s with lung cancer and was a smoker. Her paternal grandfather died in his 37s without cancer. Her grandmother was one of three children - most of whom had cancer at older ages. At least three of these great-aunts had breast cancer.   [name] is aware of previous family history of genetic testing for hereditary cancer risks. Patient's maternal ancestors are of [place] descent, and paternal ancestors are of [place] descent. There is no reported Ashkenazi Jewish ancestry. There is no known consanguinity.  GENETIC TEST RESULTS: Genetic testing reported out on 02/21/2021 through the [facility] panel. No pathogenic variants were detected.   The Multi-Cancer Panel offered by [facility] includes sequencing and/or deletion duplication testing of the following 84 genes: AIP, ALK, APC, ATM, AXIN2,BAP1,  BARD1, BLM, BMPR1A, BRCA1, BRCA2, BRIP1, CASR, CDC73, CDH1, CDK4, CDKN1B, CDKN1C, CDKN2A (p14ARF), CDKN2A (p16INK4a), CEBPA, CHEK2, CTNNA1, DICER1, DIS3L2, EGFR (c.2369C>T, p.Thr790Met variant only), EPCAM (Deletion/duplication testing only), FH, FLCN, GATA2, GPC3, GREM1 (Promoter region deletion/duplication testing only), HOXB13 (c.251G>A, p.Gly84Glu), HRAS, KIT, MAX, MEN1, MET, MITF (c.952G>A, p.Glu318Lys variant only), MLH1, MSH2, MSH3, MSH6, MUTYH, NBN, NF1, NF2, NTHL1, PALB2, PDGFRA, PHOX2B, PMS2, POLD1, POLE, POT1,  PRKAR1A, PTCH1, PTEN, RAD50, RAD51C, RAD51D, RB1, RECQL4, RET, RUNX1, SDHAF2, SDHA (sequence changes only), SDHB, SDHC, SDHD, SMAD4, SMARCA4, SMARCB1, SMARCE1, STK11, SUFU, TERC, TERT, TMEM127, TP53, TSC1, TSC2, VHL, WRN and WT1. The test report will be scanned into EPIC and located under the Molecular Pathology section of the Results Review  tab.  A portion of the result report is included below for reference.     We discussed with [name] that because current genetic testing is not perfect, it is possible there may be a gene mutation in one of these genes that current testing cannot detect, but that chance is small.  We also discussed that there could be another gene that has not yet been discovered, or that we have not yet tested, that is responsible for the cancer diagnoses in the family. It is also possible there is a hereditary cause for the cancer in the family that [name] did not inherit and therefore was not identified in her testing. Therefore, it is important to remain in touch with cancer genetics in the future so that we can continue to offer [name] the most up to date genetic testing.   We recommended [name] pursue testing for the familial CHEK2 gene mutation, called c.349A>G (p.Arg117Gly). [name] test was normal and did not reveal the familial mutation. We call this result a true negative result because the cancer-causing mutation was identified in [name] family, and she did not inherit it. Given this negative result, [name] chances of developing CHEK2-related cancers are expected to be the same as they are in the general population.    ADDITIONAL GENETIC TESTING: We discussed with [name] that her genetic testing was fairly extensive. If there are genes identified to increase cancer risk that can be analyzed in the future, we would be happy to discuss and coordinate this testing at that time.  CANCER SCREENING RECOMMENDATIONS: [name] test result is considered negative (normal).  While reassuring, this does not definitively rule out a hereditary predisposition to cancer. It is still possible that there could be genetic mutations that are undetectable by current technology. There could be genetic mutations in genes that have not been tested or identified to increase cancer risk.  Therefore, it is recommended she continue to follow the cancer management and screening guidelines provided by her primary healthcare provider.   An individual's cancer risk and medical management are not determined by genetic test results alone. Overall cancer risk assessment incorporates additional factors, including personal medical history, family history, and any available genetic information that may result in a personalized plan for cancer prevention and surveillance.  Breast Cancer Risk: Based on [name]'s personal and family history, as well as her genetic test results, the [place] was used to estimate her risk of developing breast cancer. Tyrer-Cuzick estimates her lifetime risk of developing breast cancer to be approximately 12.2%. This lifetime breast cancer risk is a preliminary estimate based on available information using one of several models endorsed by the [place] (ACS). The ACS recommends consideration of breast MRI screening as an adjunct to mammography for patients at high risk (defined as 20% or greater lifetime risk). A more detailed breast cancer risk assessment can be considered, if clinically indicated. This risk estimate can change over time and that this calculation may be repeated to reflect new information in her personal or family history in the future.     RECOMMENDATIONS FOR FAMILY MEMBERS:  Individuals in this  family might be at some increased risk of developing cancer, over the general population risk, simply due to the family history of cancer.  We recommended women in this family have a yearly mammogram beginning at age 72, or 36 years younger than the earliest onset of cancer, an annual clinical breast exam, and perform monthly breast self-exams. Women in this family should also have a gynecological exam as recommended by their primary provider. All family members should be referred for colonoscopy starting at age  25.  FOLLOW-UP: Lastly, we discussed with [name] that cancer genetics is a rapidly advancing field and it is possible that new genetic tests will be appropriate for her and/or her family members in the future. We encouraged her to remain in contact with cancer genetics on an annual basis so we can update her personal and family histories and let her know of advances in cancer genetics that may benefit this family.   Our contact number was provided. [name] questions were answered to her satisfaction, and she knows she is welcome to call us at anytime with additional questions or concerns.   Clint Guy, MS, [facility] Genetic Counselor [email or web address] Phone: [phone]

## 2021-05-22 DIAGNOSIS — Z1231 Encounter for screening mammogram for malignant neoplasm of breast: Secondary | ICD-10-CM | POA: Diagnosis not present

## 2021-05-22 DIAGNOSIS — Z01419 Encounter for gynecological examination (general) (routine) without abnormal findings: Secondary | ICD-10-CM | POA: Diagnosis not present

## 2021-05-22 DIAGNOSIS — Z6822 Body mass index (BMI) 22.0-22.9, adult: Secondary | ICD-10-CM | POA: Diagnosis not present

## 2021-09-27 DIAGNOSIS — R7989 Other specified abnormal findings of blood chemistry: Secondary | ICD-10-CM | POA: Diagnosis not present

## 2021-09-27 DIAGNOSIS — R413 Other amnesia: Secondary | ICD-10-CM | POA: Diagnosis not present

## 2021-09-27 DIAGNOSIS — E559 Vitamin D deficiency, unspecified: Secondary | ICD-10-CM | POA: Diagnosis not present

## 2021-09-30 ENCOUNTER — Other Ambulatory Visit: Payer: Self-pay | Admitting: Internal Medicine

## 2021-09-30 DIAGNOSIS — R413 Other amnesia: Secondary | ICD-10-CM

## 2021-10-04 DIAGNOSIS — Z1212 Encounter for screening for malignant neoplasm of rectum: Secondary | ICD-10-CM | POA: Diagnosis not present

## 2021-10-04 DIAGNOSIS — Z0000 Encounter for general adult medical examination without abnormal findings: Secondary | ICD-10-CM | POA: Diagnosis not present

## 2021-10-04 DIAGNOSIS — R413 Other amnesia: Secondary | ICD-10-CM | POA: Diagnosis not present

## 2021-10-04 DIAGNOSIS — R82998 Other abnormal findings in urine: Secondary | ICD-10-CM | POA: Diagnosis not present

## 2021-10-04 DIAGNOSIS — Z1331 Encounter for screening for depression: Secondary | ICD-10-CM | POA: Diagnosis not present

## 2021-10-11 ENCOUNTER — Other Ambulatory Visit: Payer: Self-pay

## 2021-10-11 ENCOUNTER — Ambulatory Visit
Admission: RE | Admit: 2021-10-11 | Discharge: 2021-10-11 | Disposition: A | Discharge status: Home / Self Care | Payer: BC Managed Care – PPO | Source: Ambulatory Visit | Attending: Internal Medicine | Admitting: Internal Medicine

## 2021-10-11 DIAGNOSIS — R413 Other amnesia: Secondary | ICD-10-CM

## 2021-11-25 DIAGNOSIS — D2272 Melanocytic nevi of left lower limb, including hip: Secondary | ICD-10-CM | POA: Diagnosis not present

## 2021-11-25 DIAGNOSIS — D225 Melanocytic nevi of trunk: Secondary | ICD-10-CM | POA: Diagnosis not present

## 2021-11-25 DIAGNOSIS — L814 Other melanin hyperpigmentation: Secondary | ICD-10-CM | POA: Diagnosis not present

## 2021-11-25 DIAGNOSIS — D2262 Melanocytic nevi of left upper limb, including shoulder: Secondary | ICD-10-CM | POA: Diagnosis not present

## 2022-06-10 DIAGNOSIS — Z1231 Encounter for screening mammogram for malignant neoplasm of breast: Secondary | ICD-10-CM | POA: Diagnosis not present

## 2022-06-10 DIAGNOSIS — Z01419 Encounter for gynecological examination (general) (routine) without abnormal findings: Secondary | ICD-10-CM | POA: Diagnosis not present

## 2022-06-10 DIAGNOSIS — Z6823 Body mass index (BMI) 23.0-23.9, adult: Secondary | ICD-10-CM | POA: Diagnosis not present

## 2022-10-15 IMAGING — MR MR HEAD W/O CM
10 series · 48 of 48 positions shown · non-contrast
Comparison: No pertinent prior exams available for comparison.

CLINICAL DATA: Provided history: Memory loss. Additional history
provided by scanning technologist: Patient forgetfulness, confusion,
delay in recall, "brain fog."

EXAM:
MRI HEAD WITHOUT CONTRAST
TECHNIQUE: Multiplanar, multiecho pulse sequences of the brain and surrounding
structures were obtained without intravenous contrast.

[Series 2: T1 · sagittal · 5.0mm · 0.45mm/px · 2 of 21 slices shown]
[im 1/21]
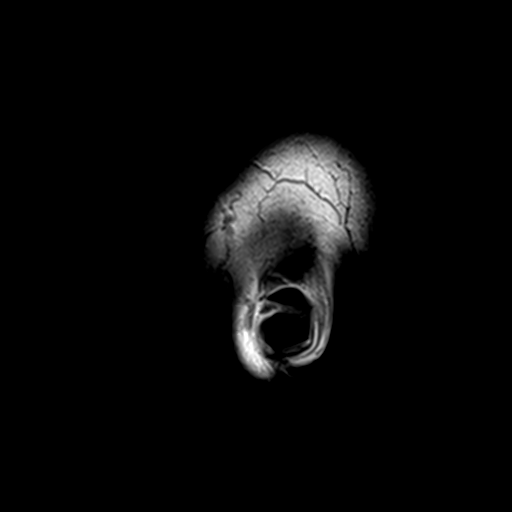
[im 21/21]
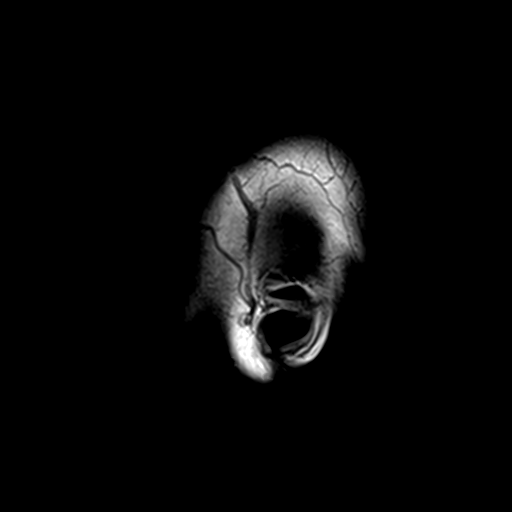

[Series 3: DWI · axial · 3.0mm · 1.80mm/px · z∈[-56,+91]mm · 9 of 100 slices shown (1 of 4)]
[im 1/100]
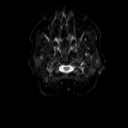
[im 13/100]
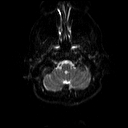
[im 25/100]
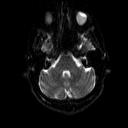
[im 38/100]
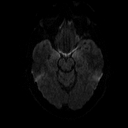
[im 50/100]
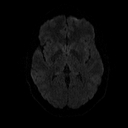
[im 62/100]
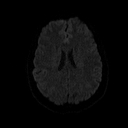
[im 75/100]
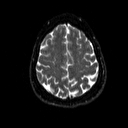
[im 87/100]
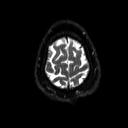
[im 100/100]
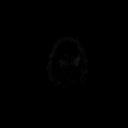

[Series 4: DWI · axial · 3.0mm · 1.80mm/px · z∈[-56,+91]mm · 4 of 50 slices shown (2 of 4)]
[im 1/50]
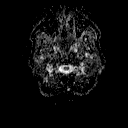
[im 17/50]
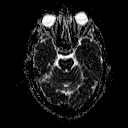
[im 33/50]
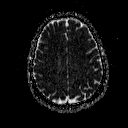
[im 50/50]
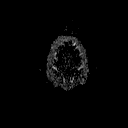

[Series 5: DWI · coronal · 5.0mm · 1.80mm/px · 6 of 68 slices shown (3 of 4)]
[im 1/68]
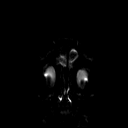
[im 14/68]
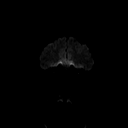
[im 27/68]
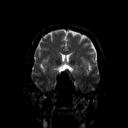
[im 41/68]
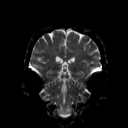
[im 54/68]
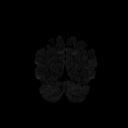
[im 68/68]
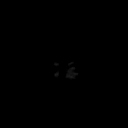

[Series 6: DWI · coronal · 5.0mm · 1.80mm/px · 3 of 34 slices shown (4 of 4)]
[im 1/34]
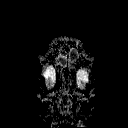
[im 17/34]
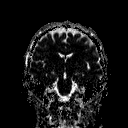
[im 34/34]
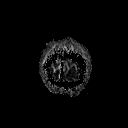

[Series 7: T2 · axial · 5.0mm · 0.51mm/px · z∈[-55,+91]mm · 2 of 22 slices shown (1 of 2)]
[im 1/22]
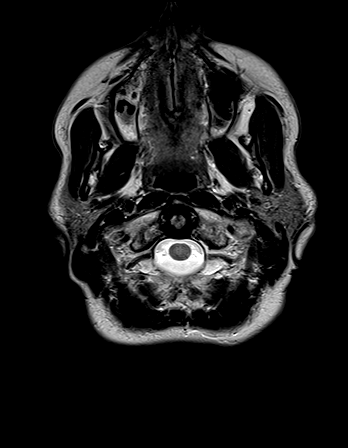
[im 22/22]
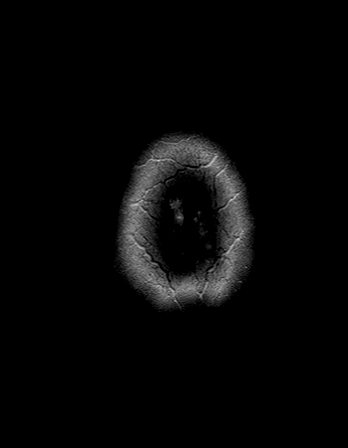

[Series 8: FLAIR · axial · 3.0mm · 0.45mm/px · z∈[-53,+96]mm · 3 of 33 slices shown]
[im 1/33]
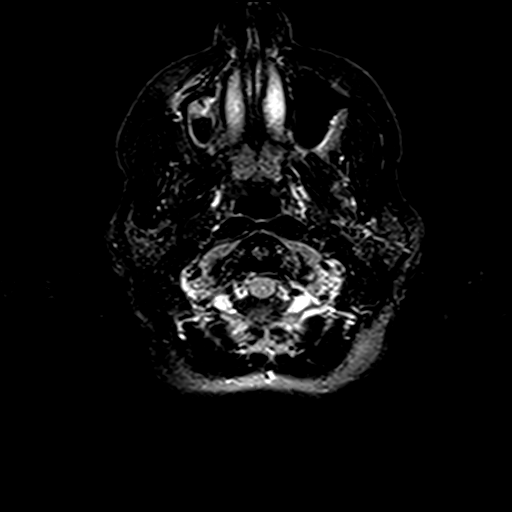
[im 17/33]
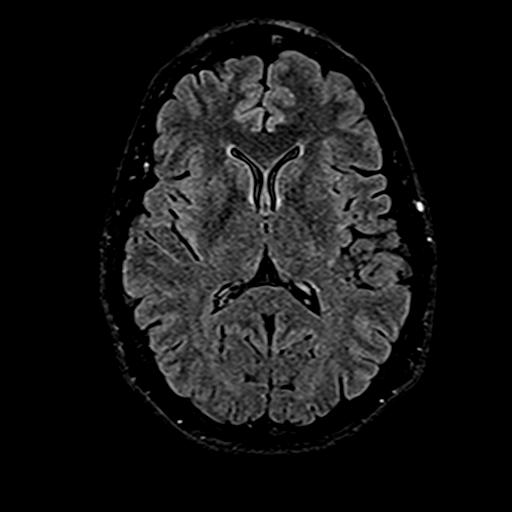
[im 33/33]
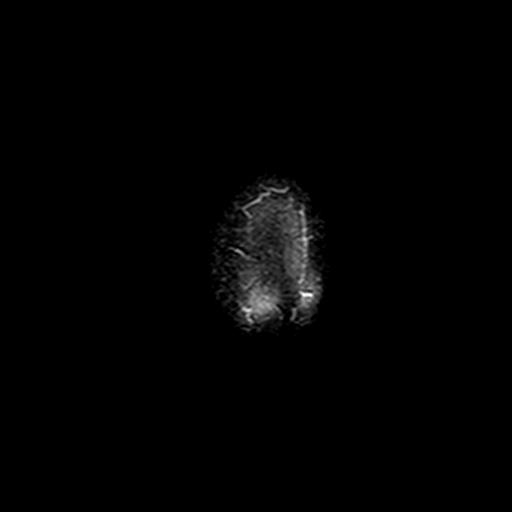

[Series 10: swi_images · axial · 4.0mm · 0.90mm/px · z∈[-60,+95]mm · 4 of 40 slices shown]
[im 1/40]
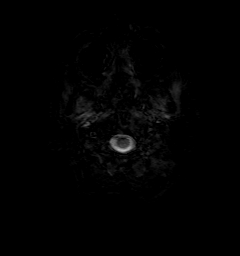
[im 14/40]
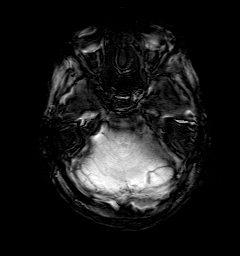
[im 27/40]
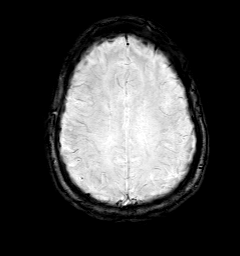
[im 40/40]
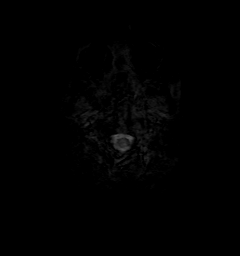

[Series 11: t1_mpr_tra · axial · 1.0mm · 0.71mm/px · z∈[-53,+89]mm · 13 of 144 slices shown]
[im 1/144]
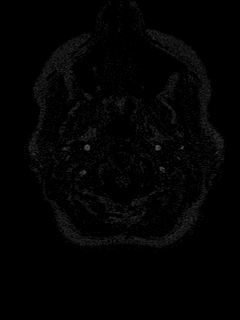
[im 12/144]
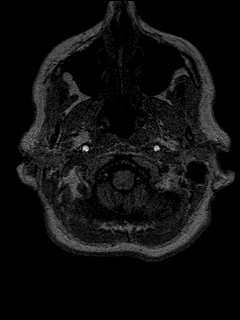
[im 24/144]
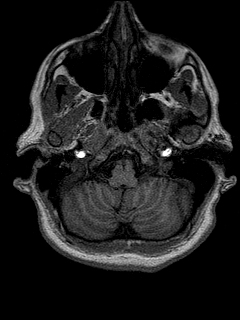
[im 36/144]
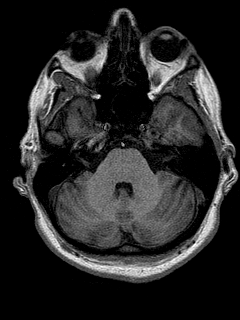
[im 48/144]
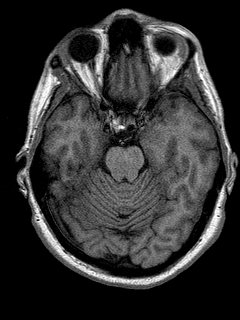
[im 60/144]
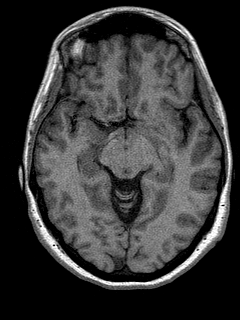
[im 72/144]
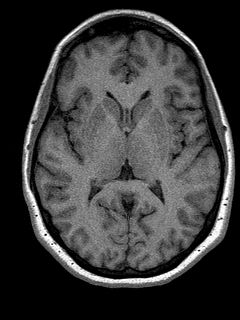
[im 84/144]
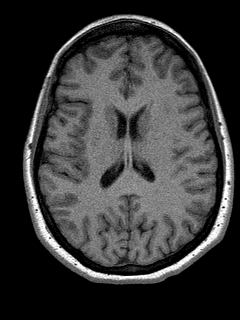
[im 96/144]
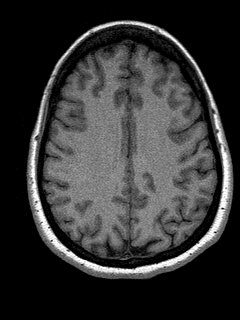
[im 108/144]
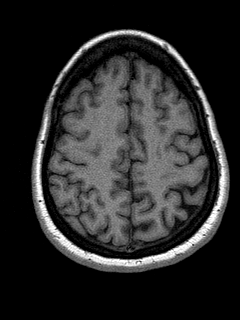
[im 120/144]
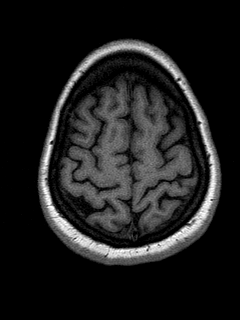
[im 132/144]
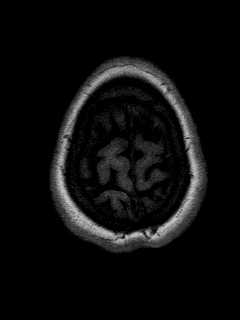
[im 144/144]
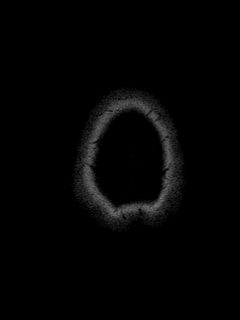

[Series 12: T2 · coronal · 5.0mm · 0.45mm/px · 2 of 28 slices shown (2 of 2)]
[im 1/28]
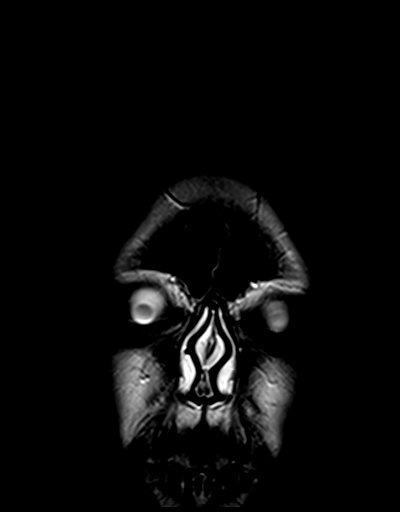
[im 28/28]
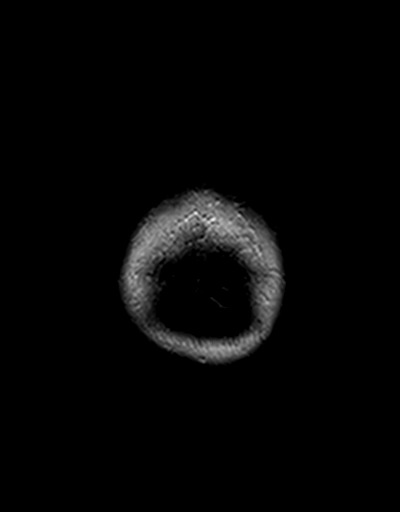

[48 of 48 positions shown; findings below may reference images not displayed]

FINDINGS: Brain:

Cerebral volume is normal.

No cortical encephalomalacia is identified. No significant cerebral
white matter disease.

There is no acute infarct.

No evidence of an intracranial mass.

No chronic intracranial blood products.

No extra-axial fluid collection.

No midline shift.

Vascular: Maintained flow voids within the proximal large arterial
vessels.

Skull and upper cervical spine: No focal suspicious marrow lesion.

Sinuses/Orbits: Visualized orbits show no acute finding. Mucosal
thickening within the bilateral ethmoid and right maxillary sinuses.
IMPRESSION: Unremarkable non-contrast MRI appearance of the brain. No evidence
of acute intracranial abnormality.

## 2022-10-17 DIAGNOSIS — R413 Other amnesia: Secondary | ICD-10-CM | POA: Diagnosis not present

## 2022-10-17 DIAGNOSIS — K219 Gastro-esophageal reflux disease without esophagitis: Secondary | ICD-10-CM | POA: Diagnosis not present

## 2022-10-17 DIAGNOSIS — E559 Vitamin D deficiency, unspecified: Secondary | ICD-10-CM | POA: Diagnosis not present

## 2022-10-17 DIAGNOSIS — R7989 Other specified abnormal findings of blood chemistry: Secondary | ICD-10-CM | POA: Diagnosis not present

## 2022-10-24 DIAGNOSIS — Z0000 Encounter for general adult medical examination without abnormal findings: Secondary | ICD-10-CM | POA: Diagnosis not present

## 2022-10-24 DIAGNOSIS — G4700 Insomnia, unspecified: Secondary | ICD-10-CM | POA: Diagnosis not present

## 2022-10-24 DIAGNOSIS — R82998 Other abnormal findings in urine: Secondary | ICD-10-CM | POA: Diagnosis not present

## 2022-10-24 DIAGNOSIS — R413 Other amnesia: Secondary | ICD-10-CM | POA: Diagnosis not present

## 2022-10-24 DIAGNOSIS — Z1331 Encounter for screening for depression: Secondary | ICD-10-CM | POA: Diagnosis not present

## 2023-01-27 DIAGNOSIS — L089 Local infection of the skin and subcutaneous tissue, unspecified: Secondary | ICD-10-CM | POA: Diagnosis not present

## 2023-01-30 DIAGNOSIS — L309 Dermatitis, unspecified: Secondary | ICD-10-CM | POA: Diagnosis not present

## 2023-01-30 DIAGNOSIS — L089 Local infection of the skin and subcutaneous tissue, unspecified: Secondary | ICD-10-CM | POA: Diagnosis not present

## 2023-03-07 DIAGNOSIS — R112 Nausea with vomiting, unspecified: Secondary | ICD-10-CM | POA: Diagnosis not present

## 2023-03-07 DIAGNOSIS — R42 Dizziness and giddiness: Secondary | ICD-10-CM | POA: Diagnosis not present

## 2023-03-07 DIAGNOSIS — Z79899 Other long term (current) drug therapy: Secondary | ICD-10-CM | POA: Diagnosis not present

## 2023-03-07 DIAGNOSIS — K3189 Other diseases of stomach and duodenum: Secondary | ICD-10-CM | POA: Diagnosis not present

## 2023-03-07 DIAGNOSIS — S0990XA Unspecified injury of head, initial encounter: Secondary | ICD-10-CM | POA: Diagnosis not present

## 2023-03-07 DIAGNOSIS — K2970 Gastritis, unspecified, without bleeding: Secondary | ICD-10-CM | POA: Diagnosis not present

## 2023-03-07 DIAGNOSIS — Z743 Need for continuous supervision: Secondary | ICD-10-CM | POA: Diagnosis not present

## 2023-03-07 DIAGNOSIS — R110 Nausea: Secondary | ICD-10-CM | POA: Diagnosis not present

## 2023-06-21 ENCOUNTER — Ambulatory Visit: Payer: Self-pay

## 2023-06-21 DIAGNOSIS — Z20822 Contact with and (suspected) exposure to covid-19: Secondary | ICD-10-CM | POA: Diagnosis not present

## 2023-06-21 DIAGNOSIS — J988 Other specified respiratory disorders: Secondary | ICD-10-CM | POA: Diagnosis not present

## 2023-06-21 DIAGNOSIS — R52 Pain, unspecified: Secondary | ICD-10-CM | POA: Diagnosis not present

## 2023-06-21 DIAGNOSIS — R051 Acute cough: Secondary | ICD-10-CM | POA: Diagnosis not present

## 2023-06-21 DIAGNOSIS — R509 Fever, unspecified: Secondary | ICD-10-CM | POA: Diagnosis not present

## 2023-06-27 DIAGNOSIS — J189 Pneumonia, unspecified organism: Secondary | ICD-10-CM | POA: Diagnosis not present

## 2023-06-27 DIAGNOSIS — R051 Acute cough: Secondary | ICD-10-CM | POA: Diagnosis not present

## 2023-07-09 DIAGNOSIS — R058 Other specified cough: Secondary | ICD-10-CM | POA: Diagnosis not present

## 2023-07-09 DIAGNOSIS — J189 Pneumonia, unspecified organism: Secondary | ICD-10-CM | POA: Diagnosis not present

## 2023-07-09 DIAGNOSIS — J029 Acute pharyngitis, unspecified: Secondary | ICD-10-CM | POA: Diagnosis not present

## 2023-07-13 DIAGNOSIS — N951 Menopausal and female climacteric states: Secondary | ICD-10-CM | POA: Diagnosis not present

## 2023-07-13 DIAGNOSIS — Z1231 Encounter for screening mammogram for malignant neoplasm of breast: Secondary | ICD-10-CM | POA: Diagnosis not present

## 2023-07-13 DIAGNOSIS — Z6823 Body mass index (BMI) 23.0-23.9, adult: Secondary | ICD-10-CM | POA: Diagnosis not present

## 2023-07-13 DIAGNOSIS — Z01419 Encounter for gynecological examination (general) (routine) without abnormal findings: Secondary | ICD-10-CM | POA: Diagnosis not present

## 2023-09-02 DIAGNOSIS — R051 Acute cough: Secondary | ICD-10-CM | POA: Diagnosis not present

## 2023-09-02 DIAGNOSIS — J069 Acute upper respiratory infection, unspecified: Secondary | ICD-10-CM | POA: Diagnosis not present

## 2023-09-02 DIAGNOSIS — J019 Acute sinusitis, unspecified: Secondary | ICD-10-CM | POA: Diagnosis not present

## 2023-09-02 DIAGNOSIS — B9689 Other specified bacterial agents as the cause of diseases classified elsewhere: Secondary | ICD-10-CM | POA: Diagnosis not present

## 2023-09-23 DIAGNOSIS — J189 Pneumonia, unspecified organism: Secondary | ICD-10-CM | POA: Diagnosis not present

## 2023-09-23 DIAGNOSIS — R058 Other specified cough: Secondary | ICD-10-CM | POA: Diagnosis not present

## 2023-09-23 DIAGNOSIS — J029 Acute pharyngitis, unspecified: Secondary | ICD-10-CM | POA: Diagnosis not present

## 2023-11-13 DIAGNOSIS — E559 Vitamin D deficiency, unspecified: Secondary | ICD-10-CM | POA: Diagnosis not present

## 2023-11-13 DIAGNOSIS — R7989 Other specified abnormal findings of blood chemistry: Secondary | ICD-10-CM | POA: Diagnosis not present

## 2023-11-13 DIAGNOSIS — Z1212 Encounter for screening for malignant neoplasm of rectum: Secondary | ICD-10-CM | POA: Diagnosis not present

## 2023-11-20 DIAGNOSIS — G4700 Insomnia, unspecified: Secondary | ICD-10-CM | POA: Diagnosis not present

## 2023-11-20 DIAGNOSIS — Z0000 Encounter for general adult medical examination without abnormal findings: Secondary | ICD-10-CM | POA: Diagnosis not present

## 2023-11-20 DIAGNOSIS — Z1339 Encounter for screening examination for other mental health and behavioral disorders: Secondary | ICD-10-CM | POA: Diagnosis not present

## 2023-11-20 DIAGNOSIS — R82998 Other abnormal findings in urine: Secondary | ICD-10-CM | POA: Diagnosis not present

## 2023-11-20 DIAGNOSIS — Z1331 Encounter for screening for depression: Secondary | ICD-10-CM | POA: Diagnosis not present

## 2024-04-09 ENCOUNTER — Emergency Department (HOSPITAL_BASED_OUTPATIENT_CLINIC_OR_DEPARTMENT_OTHER)

## 2024-04-09 ENCOUNTER — Other Ambulatory Visit: Payer: Self-pay

## 2024-04-09 ENCOUNTER — Emergency Department (HOSPITAL_BASED_OUTPATIENT_CLINIC_OR_DEPARTMENT_OTHER)
Admission: EM | Admit: 2024-04-09 | Discharge: 2024-04-10 | Disposition: A | Discharge status: Home / Self Care | Attending: Emergency Medicine | Admitting: Emergency Medicine

## 2024-04-09 ENCOUNTER — Encounter (HOSPITAL_BASED_OUTPATIENT_CLINIC_OR_DEPARTMENT_OTHER): Payer: Self-pay | Admitting: Emergency Medicine

## 2024-04-09 DIAGNOSIS — S20212A Contusion of left front wall of thorax, initial encounter: Secondary | ICD-10-CM | POA: Insufficient documentation

## 2024-04-09 DIAGNOSIS — R109 Unspecified abdominal pain: Secondary | ICD-10-CM | POA: Insufficient documentation

## 2024-04-09 DIAGNOSIS — S3991XA Unspecified injury of abdomen, initial encounter: Secondary | ICD-10-CM | POA: Diagnosis not present

## 2024-04-09 DIAGNOSIS — M79604 Pain in right leg: Secondary | ICD-10-CM | POA: Diagnosis not present

## 2024-04-09 DIAGNOSIS — S80911A Unspecified superficial injury of right knee, initial encounter: Secondary | ICD-10-CM | POA: Diagnosis not present

## 2024-04-09 DIAGNOSIS — R0789 Other chest pain: Secondary | ICD-10-CM | POA: Diagnosis not present

## 2024-04-09 DIAGNOSIS — S20222A Contusion of left back wall of thorax, initial encounter: Secondary | ICD-10-CM | POA: Diagnosis not present

## 2024-04-09 DIAGNOSIS — Y92410 Unspecified street and highway as the place of occurrence of the external cause: Secondary | ICD-10-CM | POA: Insufficient documentation

## 2024-04-09 DIAGNOSIS — S8011XA Contusion of right lower leg, initial encounter: Secondary | ICD-10-CM | POA: Diagnosis not present

## 2024-04-09 DIAGNOSIS — S299XXA Unspecified injury of thorax, initial encounter: Secondary | ICD-10-CM | POA: Diagnosis not present

## 2024-04-09 DIAGNOSIS — M25561 Pain in right knee: Secondary | ICD-10-CM | POA: Insufficient documentation

## 2024-04-09 DIAGNOSIS — J90 Pleural effusion, not elsewhere classified: Secondary | ICD-10-CM | POA: Diagnosis not present

## 2024-04-09 DIAGNOSIS — S3993XA Unspecified injury of pelvis, initial encounter: Secondary | ICD-10-CM | POA: Diagnosis not present

## 2024-04-09 LAB — CBC WITH DIFFERENTIAL/PLATELET
Abs Immature Granulocytes: 0.1 K/uL — ABNORMAL HIGH (ref 0.00–0.07)
Basophils Absolute: 0 K/uL (ref 0.0–0.1)
Basophils Relative: 0 %
Eosinophils Absolute: 0.1 K/uL (ref 0.0–0.5)
Eosinophils Relative: 1 %
HCT: 34 % — ABNORMAL LOW (ref 36.0–46.0)
Hemoglobin: 11.2 g/dL — ABNORMAL LOW (ref 12.0–15.0)
Immature Granulocytes: 1 %
Lymphocytes Relative: 12 %
Lymphs Abs: 1.2 K/uL (ref 0.7–4.0)
MCH: 28.6 pg (ref 26.0–34.0)
MCHC: 32.9 g/dL (ref 30.0–36.0)
MCV: 86.7 fL (ref 80.0–100.0)
Monocytes Absolute: 1.1 K/uL — ABNORMAL HIGH (ref 0.1–1.0)
Monocytes Relative: 10 %
Neutro Abs: 7.7 K/uL (ref 1.7–7.7)
Neutrophils Relative %: 76 %
Platelets: 211 K/uL (ref 150–400)
RBC: 3.92 MIL/uL (ref 3.87–5.11)
RDW: 14.7 % (ref 11.5–15.5)
WBC: 10.1 K/uL (ref 4.0–10.5)
nRBC: 0 % (ref 0.0–0.2)

## 2024-04-09 LAB — COMPREHENSIVE METABOLIC PANEL WITH GFR
ALT: 22 U/L (ref 0–44)
AST: 32 U/L (ref 15–41)
Albumin: 4.2 g/dL (ref 3.5–5.0)
Alkaline Phosphatase: 46 U/L (ref 38–126)
Anion gap: 11 (ref 5–15)
BUN: 20 mg/dL (ref 6–20)
CO2: 24 mmol/L (ref 22–32)
Calcium: 9.5 mg/dL (ref 8.9–10.3)
Chloride: 102 mmol/L (ref 98–111)
Creatinine, Ser: 0.78 mg/dL (ref 0.44–1.00)
GFR, Estimated: >60 mL/min (ref >60)
Glucose, Bld: 106 mg/dL — ABNORMAL HIGH (ref 70–99)
Potassium: 3.9 mmol/L (ref 3.5–5.1)
Sodium: 137 mmol/L (ref 135–145)
Total Bilirubin: 0.5 mg/dL (ref 0.0–1.2)
Total Protein: 6.7 g/dL (ref 6.5–8.1)

## 2024-04-09 LAB — HCG, SERUM, QUALITATIVE: Preg, Serum: NEGATIVE

## 2024-04-09 MED ORDER — NAPROXEN 500 MG PO TABS: 500.0000 mg | ORAL_TABLET | Freq: Two times a day (BID) | ORAL | 0 refills | Status: AC

## 2024-04-09 MED ORDER — ONDANSETRON HCL 4 MG/2ML IJ SOLN
4.0000 mg | Freq: Once | INTRAMUSCULAR | Status: AC
  Administered 2024-04-09: 4 mg via INTRAVENOUS
  Filled 2024-04-09: qty 2

## 2024-04-09 MED ORDER — LIDOCAINE 5 % EX PTCH: 1.0000 | MEDICATED_PATCH | CUTANEOUS | 0 refills | Status: AC

## 2024-04-09 MED ORDER — IOHEXOL 300 MG/ML  SOLN
80.0000 mL | Freq: Once | INTRAMUSCULAR | Status: AC | PRN
  Administered 2024-04-09: 80 mL via INTRAVENOUS

## 2024-04-09 MED ORDER — NAPROXEN 500 MG PO TABS: 500.0000 mg | ORAL_TABLET | Freq: Two times a day (BID) | ORAL | 0 refills | Status: DC

## 2024-04-09 MED ORDER — MORPHINE SULFATE (PF) 4 MG/ML IV SOLN
4.0000 mg | Freq: Once | INTRAVENOUS | Status: AC
  Administered 2024-04-09: 4 mg via INTRAVENOUS
  Filled 2024-04-09: qty 1

## 2024-04-09 MED ORDER — METHOCARBAMOL 500 MG PO TABS: 500.0000 mg | ORAL_TABLET | Freq: Two times a day (BID) | ORAL | 0 refills | Status: DC

## 2024-04-09 MED ORDER — METHOCARBAMOL 500 MG PO TABS: 500.0000 mg | ORAL_TABLET | Freq: Two times a day (BID) | ORAL | 0 refills | Status: AC

## 2024-04-09 MED ORDER — DICLOFENAC SODIUM 1 % EX GEL: 2.0000 g | Freq: Four times a day (QID) | CUTANEOUS | 0 refills | Status: DC

## 2024-04-09 NOTE — Discharge Instructions (Addendum)
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
## 2024-04-13 ENCOUNTER — Emergency Department (HOSPITAL_BASED_OUTPATIENT_CLINIC_OR_DEPARTMENT_OTHER)
Admission: EM | Admit: 2024-04-13 | Discharge: 2024-04-13 | Disposition: A | Discharge status: Home / Self Care | Attending: Emergency Medicine | Admitting: Emergency Medicine

## 2024-04-13 ENCOUNTER — Emergency Department (HOSPITAL_BASED_OUTPATIENT_CLINIC_OR_DEPARTMENT_OTHER)

## 2024-04-13 ENCOUNTER — Other Ambulatory Visit: Payer: Self-pay

## 2024-04-13 DIAGNOSIS — R079 Chest pain, unspecified: Secondary | ICD-10-CM | POA: Insufficient documentation

## 2024-04-13 DIAGNOSIS — R109 Unspecified abdominal pain: Secondary | ICD-10-CM | POA: Insufficient documentation

## 2024-04-13 DIAGNOSIS — M79604 Pain in right leg: Secondary | ICD-10-CM | POA: Insufficient documentation

## 2024-04-13 DIAGNOSIS — J90 Pleural effusion, not elsewhere classified: Secondary | ICD-10-CM | POA: Diagnosis not present

## 2024-04-13 DIAGNOSIS — R0789 Other chest pain: Secondary | ICD-10-CM | POA: Diagnosis not present

## 2024-04-13 MED ORDER — DICLOFENAC SODIUM 1 % EX GEL
2.0000 g | Freq: Four times a day (QID) | CUTANEOUS | 0 refills | Status: AC
  Filled 2024-04-13: qty 100, 7d supply, fill #0
  Filled 2024-04-13: qty 200, fill #0
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
## 2024-04-13 NOTE — ED Notes (Signed)
 Discharge instructions reviewed.   Newly prescribed medications discussed. Pharmacy verified.   Opportunity for questions and concerns provided.   Alert, oriented and ambulatory.   Displays no signs of distress.
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
## 2024-04-14 ENCOUNTER — Other Ambulatory Visit (HOSPITAL_BASED_OUTPATIENT_CLINIC_OR_DEPARTMENT_OTHER): Payer: Self-pay

## 2024-04-14 DIAGNOSIS — F4320 Adjustment disorder, unspecified: Secondary | ICD-10-CM | POA: Diagnosis not present

## 2024-04-21 DIAGNOSIS — F4320 Adjustment disorder, unspecified: Secondary | ICD-10-CM | POA: Diagnosis not present

## 2024-04-25 ENCOUNTER — Other Ambulatory Visit (HOSPITAL_BASED_OUTPATIENT_CLINIC_OR_DEPARTMENT_OTHER): Payer: Self-pay

## 2024-05-12 DIAGNOSIS — N921 Excessive and frequent menstruation with irregular cycle: Secondary | ICD-10-CM | POA: Diagnosis not present

## 2024-05-12 DIAGNOSIS — N939 Abnormal uterine and vaginal bleeding, unspecified: Secondary | ICD-10-CM | POA: Diagnosis not present

## 2024-05-12 DIAGNOSIS — F411 Generalized anxiety disorder: Secondary | ICD-10-CM | POA: Diagnosis not present

## 2024-05-20 ENCOUNTER — Emergency Department (HOSPITAL_BASED_OUTPATIENT_CLINIC_OR_DEPARTMENT_OTHER)
Admission: EM | Admit: 2024-05-20 | Discharge: 2024-05-20 | Disposition: A | Discharge status: Home / Self Care | Source: Ambulatory Visit | Attending: Emergency Medicine | Admitting: Emergency Medicine

## 2024-05-20 ENCOUNTER — Other Ambulatory Visit: Payer: Self-pay

## 2024-05-20 ENCOUNTER — Emergency Department (HOSPITAL_BASED_OUTPATIENT_CLINIC_OR_DEPARTMENT_OTHER)

## 2024-05-20 DIAGNOSIS — N939 Abnormal uterine and vaginal bleeding, unspecified: Secondary | ICD-10-CM | POA: Diagnosis not present

## 2024-05-20 LAB — CBC WITH DIFFERENTIAL/PLATELET
Abs Immature Granulocytes: 0.03 K/uL (ref 0.00–0.07)
Basophils Absolute: 0 K/uL (ref 0.0–0.1)
Basophils Relative: 1 %
Eosinophils Absolute: 0.1 K/uL (ref 0.0–0.5)
Eosinophils Relative: 2 %
HCT: 28.3 % — ABNORMAL LOW (ref 36.0–46.0)
Hemoglobin: 9.1 g/dL — ABNORMAL LOW (ref 12.0–15.0)
Immature Granulocytes: 1 %
Lymphocytes Relative: 21 %
Lymphs Abs: 1.2 K/uL (ref 0.7–4.0)
MCH: 28.1 pg (ref 26.0–34.0)
MCHC: 32.2 g/dL (ref 30.0–36.0)
MCV: 87.3 fL (ref 80.0–100.0)
Monocytes Absolute: 0.7 K/uL (ref 0.1–1.0)
Monocytes Relative: 12 %
Neutro Abs: 3.7 K/uL (ref 1.7–7.7)
Neutrophils Relative %: 63 %
Platelets: 179 K/uL (ref 150–400)
RBC: 3.24 MIL/uL — ABNORMAL LOW (ref 3.87–5.11)
RDW: 14 % (ref 11.5–15.5)
WBC: 5.8 K/uL (ref 4.0–10.5)
nRBC: 0 % (ref 0.0–0.2)

## 2024-05-20 LAB — BASIC METABOLIC PANEL WITH GFR
Anion gap: 10 (ref 5–15)
BUN: 15 mg/dL (ref 6–20)
CO2: 24 mmol/L (ref 22–32)
Calcium: 9 mg/dL (ref 8.9–10.3)
Chloride: 105 mmol/L (ref 98–111)
Creatinine, Ser: 0.6 mg/dL (ref 0.44–1.00)
GFR, Estimated: >60 mL/min (ref >60)
Glucose, Bld: 86 mg/dL (ref 70–99)
Potassium: 4.3 mmol/L (ref 3.5–5.1)
Sodium: 139 mmol/L (ref 135–145)

## 2024-05-20 LAB — HCG, SERUM, QUALITATIVE: Preg, Serum: NEGATIVE

## 2024-05-20 MED ORDER — FERROUS SULFATE 325 (65 FE) MG PO TABS
325.0000 mg | ORAL_TABLET | ORAL | 0 refills | Status: AC
Start: 2024-05-20 — End: ?

## 2024-05-20 NOTE — Discharge Instructions (Addendum)
 Please read and follow all provided instructions.  Your diagnoses today include:  1. Abnormal uterine bleeding (AUB)    Tests performed today include: Complete blood cell count: Your hemoglobin today was 9.1, this is down from 11.2 a month ago when you were seen. Pelvic ultrasound today showed thickening of the endometrial lining of the uterus which could be related to current menstrual period stage, but they cannot rule out an endometrial polyp as a cause of your bleeding given the appearance, although the ultrasound is not definitive in regards to this. It also showed ovarian cysts on each side however these are not likely contributing to your symptoms. Vital signs. See below for your results today.   Medications prescribed:  Iron supplementation  Take any prescribed medications only as directed.  Home care instructions:  Follow any educational materials contained in this packet.  I recommend using the medication prescribed by your OB/GYN, tranexamic acid, to see if this helps slow your bleeding.  Follow-up instructions: Please follow-up with your OB/GYN as planned  Return instructions:  Please return to the Emergency Department if you experience worsening symptoms.  Please return with worsening symptoms of anemia which include more significant fatigue or lightheadedness, shortness of breath, passing out Please return if you have any other emergent concerns.  Additional Information:  Your vital signs today were: BP 109/76   Pulse 74   Temp 98.3 F (36.8 C) (Oral)   Resp 14   SpO2 100%  If your blood pressure (BP) was elevated above 135/85 this visit, please have this repeated by your doctor within one month. --------------

## 2024-05-20 NOTE — ED Triage Notes (Signed)
 Patient states vaginal bleeding for the past several weeks. Saw Ob for same but bleeding increased this morning with large clots. Scheduled for US  Oct 1 but OB recommended ER faster care
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
## 2024-06-01 DIAGNOSIS — N921 Excessive and frequent menstruation with irregular cycle: Secondary | ICD-10-CM | POA: Diagnosis not present

## 2024-07-05 DIAGNOSIS — N840 Polyp of corpus uteri: Secondary | ICD-10-CM | POA: Diagnosis not present
